# Patient Record
Sex: Male | Born: 1985 | Hispanic: No | Marital: Single | Smoking: Never smoker
Health system: Southern US, Community
[De-identification: ages and names within clinical notes are randomized; demographics above are authoritative.]

---

## 2015-06-01 ENCOUNTER — Encounter (HOSPITAL_BASED_OUTPATIENT_CLINIC_OR_DEPARTMENT_OTHER): Payer: Self-pay | Admitting: *Deleted

## 2015-06-01 ENCOUNTER — Emergency Department (HOSPITAL_BASED_OUTPATIENT_CLINIC_OR_DEPARTMENT_OTHER): Payer: No Typology Code available for payment source

## 2015-06-01 ENCOUNTER — Emergency Department (HOSPITAL_BASED_OUTPATIENT_CLINIC_OR_DEPARTMENT_OTHER)
Admission: EM | Admit: 2015-06-01 | Discharge: 2015-06-01 | Disposition: A | Discharge status: Home / Self Care | Payer: No Typology Code available for payment source | Attending: Emergency Medicine | Admitting: Emergency Medicine

## 2015-06-01 DIAGNOSIS — M79602 Pain in left arm: Secondary | ICD-10-CM

## 2015-06-01 DIAGNOSIS — S5012XA Contusion of left forearm, initial encounter: Secondary | ICD-10-CM | POA: Insufficient documentation

## 2015-06-01 DIAGNOSIS — Y9389 Activity, other specified: Secondary | ICD-10-CM | POA: Insufficient documentation

## 2015-06-01 DIAGNOSIS — Y92410 Unspecified street and highway as the place of occurrence of the external cause: Secondary | ICD-10-CM | POA: Insufficient documentation

## 2015-06-01 DIAGNOSIS — S6992XA Unspecified injury of left wrist, hand and finger(s), initial encounter: Secondary | ICD-10-CM | POA: Diagnosis not present

## 2015-06-01 DIAGNOSIS — Y998 Other external cause status: Secondary | ICD-10-CM | POA: Diagnosis not present

## 2015-06-01 DIAGNOSIS — S50812A Abrasion of left forearm, initial encounter: Secondary | ICD-10-CM | POA: Diagnosis not present

## 2015-06-01 MED ORDER — BACITRACIN 500 UNIT/GM EX OINT
1.0000 "application " | TOPICAL_OINTMENT | Freq: Two times a day (BID) | CUTANEOUS | Status: DC
  Administered 2015-06-01: 1 via TOPICAL
  Filled 2015-06-01: qty 0.9

## 2015-06-01 NOTE — Discharge Instructions (Signed)
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
## 2015-06-01 NOTE — ED Notes (Signed)
Dressing placed in lt forearm.
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
